# Patient Record
Sex: Male | Born: 1980 | Race: Black or African American | Hispanic: No | Marital: Single | Smoking: Current every day smoker
Health system: Southern US, Community
[De-identification: ages and names within clinical notes are randomized; demographics above are authoritative.]

---

## 2007-06-06 ENCOUNTER — Emergency Department: Payer: Self-pay | Admitting: Emergency Medicine

## 2007-06-07 ENCOUNTER — Ambulatory Visit: Payer: Self-pay | Admitting: Urology

## 2009-05-04 ENCOUNTER — Emergency Department: Payer: Self-pay | Admitting: Emergency Medicine

## 2012-07-03 ENCOUNTER — Emergency Department: Payer: Self-pay | Admitting: Emergency Medicine

## 2012-07-03 LAB — URINALYSIS, COMPLETE
Bacteria: NONE SEEN
Bilirubin,UR: NEGATIVE
Glucose,UR: NEGATIVE mg/dL (ref 0–75)
Ketone: NEGATIVE
Leukocyte Esterase: NEGATIVE
Nitrite: NEGATIVE
Ph: 8 (ref 4.5–8.0)
RBC,UR: 14 /HPF (ref 0–5)
Specific Gravity: 1.013 (ref 1.003–1.030)
Squamous Epithelial: NONE SEEN
WBC UR: 5 /HPF (ref 0–5)

## 2012-07-03 LAB — COMPREHENSIVE METABOLIC PANEL
Anion Gap: 4 — ABNORMAL LOW (ref 7–16)
Co2: 28 mmol/L (ref 21–32)
Creatinine: 1.15 mg/dL (ref 0.60–1.30)
EGFR (African American): >60
EGFR (Non-African Amer.): >60
Osmolality: 280 (ref 275–301)
Potassium: 4.3 mmol/L (ref 3.5–5.1)
Sodium: 140 mmol/L (ref 136–145)

## 2012-07-03 LAB — CBC
HCT: 45.1 % (ref 40.0–52.0)
HGB: 15.1 g/dL (ref 13.0–18.0)
MCHC: 33.6 g/dL (ref 32.0–36.0)
MCV: 98 fL (ref 80–100)
Platelet: 198 10*3/uL (ref 150–440)
RBC: 4.62 10*6/uL (ref 4.40–5.90)
RDW: 12.7 % (ref 11.5–14.5)

## 2012-07-03 LAB — LIPASE, BLOOD: Lipase: 101 U/L (ref 73–393)

## 2012-07-06 ENCOUNTER — Emergency Department: Payer: Self-pay | Admitting: Emergency Medicine

## 2012-07-06 LAB — CBC
HCT: 44.1 % (ref 40.0–52.0)
HGB: 14.9 g/dL (ref 13.0–18.0)
MCHC: 33.7 g/dL (ref 32.0–36.0)
Platelet: 199 10*3/uL (ref 150–440)
RBC: 4.5 10*6/uL (ref 4.40–5.90)
WBC: 5.8 10*3/uL (ref 3.8–10.6)

## 2012-07-06 LAB — URINALYSIS, COMPLETE
Bacteria: NONE SEEN
Blood: NEGATIVE
Ketone: NEGATIVE
Ph: 6 (ref 4.5–8.0)
RBC,UR: 1 /HPF (ref 0–5)
Specific Gravity: 1.019 (ref 1.003–1.030)
Squamous Epithelial: NONE SEEN

## 2012-07-06 LAB — COMPREHENSIVE METABOLIC PANEL
Albumin: 4.1 g/dL (ref 3.4–5.0)
Anion Gap: 5 — ABNORMAL LOW (ref 7–16)
BUN: 14 mg/dL (ref 7–18)
Calcium, Total: 9.2 mg/dL (ref 8.5–10.1)
Chloride: 109 mmol/L — ABNORMAL HIGH (ref 98–107)
Creatinine: 1.08 mg/dL (ref 0.60–1.30)
EGFR (African American): >60
EGFR (Non-African Amer.): >60
SGOT(AST): 22 U/L (ref 15–37)
Total Protein: 7.8 g/dL (ref 6.4–8.2)

## 2014-08-20 ENCOUNTER — Emergency Department
Admission: EM | Admit: 2014-08-20 | Discharge: 2014-08-20 | Disposition: A | Discharge status: Home / Self Care | Payer: Self-pay | Attending: Emergency Medicine | Admitting: Emergency Medicine

## 2014-08-20 ENCOUNTER — Emergency Department: Payer: Self-pay

## 2014-08-20 ENCOUNTER — Other Ambulatory Visit: Payer: Self-pay

## 2014-08-20 DIAGNOSIS — R55 Syncope and collapse: Secondary | ICD-10-CM | POA: Insufficient documentation

## 2014-08-20 DIAGNOSIS — E860 Dehydration: Secondary | ICD-10-CM | POA: Insufficient documentation

## 2014-08-20 LAB — COMPREHENSIVE METABOLIC PANEL
ALT: 11 U/L — ABNORMAL LOW (ref 17–63)
AST: 17 U/L (ref 15–41)
Albumin: 4.2 g/dL (ref 3.5–5.0)
Alkaline Phosphatase: 32 U/L — ABNORMAL LOW (ref 38–126)
Anion gap: 5 (ref 5–15)
BILIRUBIN TOTAL: 0.5 mg/dL (ref 0.3–1.2)
BUN: 13 mg/dL (ref 6–20)
CO2: 26 mmol/L (ref 22–32)
Calcium: 9 mg/dL (ref 8.9–10.3)
Chloride: 109 mmol/L (ref 101–111)
Creatinine, Ser: 1.29 mg/dL — ABNORMAL HIGH (ref 0.61–1.24)
GFR calc non Af Amer: >60 mL/min (ref >60)
Glucose, Bld: 106 mg/dL — ABNORMAL HIGH (ref 65–99)
Potassium: 4.7 mmol/L (ref 3.5–5.1)
Sodium: 140 mmol/L (ref 135–145)
TOTAL PROTEIN: 7 g/dL (ref 6.5–8.1)

## 2014-08-20 LAB — CBC WITH DIFFERENTIAL/PLATELET
BASOS ABS: 0 10*3/uL (ref 0–0.1)
Basophils Relative: 0 %
Eosinophils Absolute: 0 10*3/uL (ref 0–0.7)
Eosinophils Relative: 1 %
HEMATOCRIT: 45.8 % (ref 40.0–52.0)
Hemoglobin: 15.4 g/dL (ref 13.0–18.0)
Lymphocytes Relative: 14 %
Lymphs Abs: 1.5 10*3/uL (ref 1.0–3.6)
MCH: 33.1 pg (ref 26.0–34.0)
MCHC: 33.6 g/dL (ref 32.0–36.0)
MCV: 98.7 fL (ref 80.0–100.0)
Monocytes Absolute: 0.7 10*3/uL (ref 0.2–1.0)
Monocytes Relative: 7 %
Neutro Abs: 8.4 10*3/uL — ABNORMAL HIGH (ref 1.4–6.5)
Neutrophils Relative %: 78 %
Platelets: 143 10*3/uL — ABNORMAL LOW (ref 150–440)
RBC: 4.64 MIL/uL (ref 4.40–5.90)
RDW: 13.2 % (ref 11.5–14.5)
WBC: 10.7 10*3/uL — ABNORMAL HIGH (ref 3.8–10.6)

## 2014-08-20 LAB — ETHANOL: Alcohol, Ethyl (B): <5 mg/dL (ref <5)

## 2014-08-20 LAB — TROPONIN I

## 2014-08-20 LAB — SALICYLATE LEVEL

## 2014-08-20 LAB — ACETAMINOPHEN LEVEL: Acetaminophen (Tylenol), Serum: <10 ug/mL — ABNORMAL LOW (ref 10–30)

## 2014-08-20 LAB — CK: Total CK: 160 U/L (ref 49–397)

## 2014-08-20 LAB — FIBRIN DERIVATIVES D-DIMER (ARMC ONLY): FIBRIN DERIVATIVES D-DIMER (ARMC): 149.08 (ref 0–499)

## 2014-08-20 MED ORDER — SODIUM CHLORIDE 0.9 % IV BOLUS (SEPSIS)
1000.0000 mL | Freq: Once | INTRAVENOUS | Status: AC
  Administered 2014-08-20: 1000 mL via INTRAVENOUS

## 2014-08-20 NOTE — Discharge Instructions (Signed)
1. Drink plenty of fluids daily. 2. Avoid extreme heat and prolonged standing. 3. Return to the ER for worsening symptoms, persistent vomiting, difficulty breathing or other concerns.  Syncope Syncope is a medical term for fainting or passing out. This means you lose consciousness and drop to the ground. People are generally unconscious for less than 5 minutes. You may have some muscle twitches for up to 15 seconds before waking up and returning to normal. Syncope occurs more often in older adults, but it can happen to anyone. While most causes of syncope are not dangerous, syncope can be a sign of a serious medical problem. It is important to seek medical care.  CAUSES  Syncope is caused by a sudden drop in blood flow to the brain. The specific cause is often not determined. Factors that can bring on syncope include:  Taking medicines that lower blood pressure.  Sudden changes in posture, such as standing up quickly.  Taking more medicine than prescribed.  Standing in one place for too long.  Seizure disorders.  Dehydration and excessive exposure to heat.  Low blood sugar (hypoglycemia).  Straining to have a bowel movement.  Heart disease, irregular heartbeat, or other circulatory problems.  Fear, emotional distress, seeing blood, or severe pain. SYMPTOMS  Right before fainting, you may:  Feel dizzy or light-headed.  Feel nauseous.  See all white or all black in your field of vision.  Have cold, clammy skin. DIAGNOSIS  Your health care provider will ask about your symptoms, perform a physical exam, and perform an electrocardiogram (ECG) to record the electrical activity of your heart. Your health care provider may also perform other heart or blood tests to determine the cause of your syncope which may include:  Transthoracic echocardiogram (TTE). During echocardiography, sound waves are used to evaluate how blood flows through your heart.  Transesophageal echocardiogram  (TEE).  Cardiac monitoring. This allows your health care provider to monitor your heart rate and rhythm in real time.  Holter monitor. This is a portable device that records your heartbeat and can help diagnose heart arrhythmias. It allows your health care provider to track your heart activity for several days, if needed.  Stress tests by exercise or by giving medicine that makes the heart beat faster. TREATMENT  In most cases, no treatment is needed. Depending on the cause of your syncope, your health care provider may recommend changing or stopping some of your medicines. HOME CARE INSTRUCTIONS  Have someone stay with you until you feel stable.  Do not drive, use machinery, or play sports until your health care provider says it is okay.  Keep all follow-up appointments as directed by your health care provider.  Lie down right away if you start feeling like you might faint. Breathe deeply and steadily. Wait until all the symptoms have passed.  Drink enough fluids to keep your urine clear or pale yellow.  If you are taking blood pressure or heart medicine, get up slowly and take several minutes to sit and then stand. This can reduce dizziness. SEEK IMMEDIATE MEDICAL CARE IF:   You have a severe headache.  You have unusual pain in the chest, abdomen, or back.  You are bleeding from your mouth or rectum, or you have black or tarry stool.  You have an irregular or very fast heartbeat.  You have pain with breathing.  You have repeated fainting or seizure-like jerking during an episode.  You faint when sitting or lying down.  You have confusion.  You have trouble walking.  You have severe weakness.  You have vision problems. If you fainted, call your local emergency services (911 in [place] Do not drive yourself to the hospital.  MAKE SURE YOU:  Understand these instructions.  Will watch your condition.  Will get help right away if you are not doing well or get  worse. Document Released: 02/27/2005 Document Revised: 03/04/2013 Document Reviewed: 04/28/2011 [facility] Patient Information 2015 [place], [place]. This information is not intended to replace advice given to you by your health care provider. Make sure you discuss any questions you have with your health care provider.  Dehydration, Adult Dehydration is when you lose more fluids from the body than you take in. Vital organs like the kidneys, brain, and heart cannot function without a proper amount of fluids and salt. Any loss of fluids from the body can cause dehydration.  CAUSES   Vomiting.  Diarrhea.  Excessive sweating.  Excessive urine output.  Fever. SYMPTOMS  Mild dehydration  Thirst.  Dry lips.  Slightly dry mouth. Moderate dehydration  Very dry mouth.  Sunken eyes.  Skin does not bounce back quickly when lightly pinched and released.  Dark urine and decreased urine production.  Decreased tear production.  Headache. Severe dehydration  Very dry mouth.  Extreme thirst.  Rapid, weak pulse (more than 100 beats per minute at rest).  Cold hands and feet.  Not able to sweat in spite of heat and temperature.  Rapid breathing.  Blue lips.  Confusion and lethargy.  Difficulty being awakened.  Minimal urine production.  No tears. DIAGNOSIS  Your caregiver will diagnose dehydration based on your symptoms and your exam. Blood and urine tests will help confirm the diagnosis. The diagnostic evaluation should also identify the cause of dehydration. TREATMENT  Treatment of mild or moderate dehydration can often be done at home by increasing the amount of fluids that you drink. It is best to drink small amounts of fluid more often. Drinking too much at one time can make vomiting worse. Refer to the home care instructions below. Severe dehydration needs to be treated at the hospital where you will probably be given intravenous (IV) fluids that contain water and  electrolytes. HOME CARE INSTRUCTIONS   Ask your caregiver about specific rehydration instructions.  Drink enough fluids to keep your urine clear or pale yellow.  Drink small amounts frequently if you have nausea and vomiting.  Eat as you normally do.  Avoid:  Foods or drinks high in sugar.  Carbonated drinks.  Juice.  Extremely hot or cold fluids.  Drinks with caffeine.  Fatty, greasy foods.  Alcohol.  Tobacco.  Overeating.  Gelatin desserts.  Wash your hands well to avoid spreading bacteria and viruses.  Only take over-the-counter or prescription medicines for pain, discomfort, or fever as directed by your caregiver.  Ask your caregiver if you should continue all prescribed and over-the-counter medicines.  Keep all follow-up appointments with your caregiver. SEEK MEDICAL CARE IF:  You have abdominal pain and it increases or stays in one area (localizes).  You have a rash, stiff neck, or severe headache.  You are irritable, sleepy, or difficult to awaken.  You are weak, dizzy, or extremely thirsty. SEEK IMMEDIATE MEDICAL CARE IF:   You are unable to keep fluids down or you get worse despite treatment.  You have frequent episodes of vomiting or diarrhea.  You have blood or green matter (bile) in your vomit.  You have blood in your stool or your  stool looks black and tarry.  You have not urinated in 6 to 8 hours, or you have only urinated a small amount of very dark urine.  You have a fever.  You faint. MAKE SURE YOU:   Understand these instructions.  Will watch your condition.  Will get help right away if you are not doing well or get worse. Document Released: 02/27/2005 Document Revised: 05/22/2011 Document Reviewed: 10/17/2010 [facility] Patient Information 2015 [place], [place]. This information is not intended to replace advice given to you by your health care provider. Make sure you discuss any questions you have with your health care  provider.

## 2014-08-20 NOTE — ED Notes (Signed)
Pt uprite on stretcher in exam room with no distress noted; eating ice chips; family at bedside; pt denies any c/o at present but reports PTA while having intercourse, had sudden onset pain to left chest/arm

## 2014-08-20 NOTE — ED Provider Notes (Signed)
[facility] Emergency Department Provider Note  ____________________________________________  Time seen: Approximately 2:03 AM  I have reviewed the triage vital signs and the nursing notes.   HISTORY  Chief Complaint Loss of Consciousness    HPI Bryan Parsons is a 34 y.o. male who presents via EMS for syncope. Patient states after intercourse he stood up, felt lightheaded and dizzy and sat down on the floor. Patient's spouse says he was "in and out". States had another syncopal episode when EMS stood him up to walk to the ambulance. Patient has had a similar episode due to being overworked and dehydrated. Patient denies headache, chest pain, shortness of breath, vomiting, diarrhea, weakness, numbness, tingling. Patient states he felt abdominal cramping prior to syncopal episode but none currently.Specifically asked patient about chest pain/left arm pain while having intercourse but patient denies.   Past medical history None There are no active problems to display for this patient.  Patient works 3 jobs.  Past surgical history None  No current outpatient prescriptions on file.  Allergies Review of patient's allergies indicates no known allergies.  Family history Grandmother with CAD No history of sudden death No history of cerebral aneurysm  Social History History  Substance Use Topics  . Smoking status: Not on file  . Smokeless tobacco: Not on file  . Alcohol Use: Not on file   Denies tobacco/EtOH/drug use  Review of Systems Constitutional: No fever/chills Eyes: No visual changes. ENT: No sore throat. Cardiovascular: Denies chest pain. Respiratory: Denies shortness of breath. Gastrointestinal: Positive for abdominal cramping.  No nausea, no vomiting.  No diarrhea.  No constipation. Genitourinary: Negative for dysuria. Musculoskeletal: Negative for back pain. Skin: Negative for rash. Neurological: Negative for headaches, focal  weakness or numbness.  10-point ROS otherwise negative.  ____________________________________________   PHYSICAL EXAM:  VITAL SIGNS: ED Triage Vitals  Enc Vitals Group     BP 08/20/14 0100 95/64 mmHg     Pulse Rate 08/20/14 0100 54     Resp 08/20/14 0100 18     Temp 08/20/14 0100 97.5 F (36.4 C)     Temp Source 08/20/14 0100 Oral     SpO2 08/20/14 0100 100 %     Weight 08/20/14 0100 145 lb (65.772 kg)     Height 08/20/14 0100 5\' 8"  (1.727 m)     Head Cir --      Peak Flow --      Pain Score --      Pain Loc --      Pain Edu? --      Excl. in GC? --     Constitutional: Alert and oriented. Well appearing and in no acute distress. Eyes: Conjunctivae are normal. PERRL. EOMI. Head: Atraumatic. Nose: No congestion/rhinnorhea. Mouth/Throat: Mucous membranes are mildly dry.  Oropharynx non-erythematous. Neck: No stridor.   Cardiovascular: Normal rate, regular rhythm. Grossly normal heart sounds.  Good peripheral circulation. Respiratory: Normal respiratory effort.  No retractions. Lungs CTAB. Gastrointestinal: Soft and nontender. No distention. No abdominal bruits. No CVA tenderness. Musculoskeletal: No lower extremity tenderness nor edema.  No joint effusions. Neurologic:  Normal speech and language. No gross focal neurologic deficits are appreciated. Speech is normal.  Skin:  Skin is warm, dry and intact. No rash noted. Psychiatric: Mood and affect are normal. Speech and behavior are normal.  ____________________________________________   LABS (all labs ordered are listed, but only abnormal results are displayed)  Labs Reviewed  CBC WITH DIFFERENTIAL/PLATELET - Abnormal; Notable for the following:  WBC 10.7 (*)    Platelets 143 (*)    Neutro Abs 8.4 (*)    All other components within normal limits  COMPREHENSIVE METABOLIC PANEL - Abnormal; Notable for the following:    Glucose, Bld 106 (*)    Creatinine, Ser 1.29 (*)    ALT 11 (*)    Alkaline Phosphatase 32 (*)     All other components within normal limits  ACETAMINOPHEN LEVEL - Abnormal; Notable for the following:    Acetaminophen (Tylenol), Serum <10 (*)    All other components within normal limits  ETHANOL  TROPONIN I  SALICYLATE LEVEL  CK  FIBRIN DERIVATIVES D-DIMER ([facility] ONLY)   ____________________________________________  EKG  ED ECG REPORT I, SUNG,JADE J, the attending physician, personally viewed and interpreted this ECG.   Date: 08/20/2014  EKG Time: 0058  Rate: 52  Rhythm: sinus bradycardia  Axis: Normal  Intervals:none  ST&T Change: Nonspecific  ____________________________________________  RADIOLOGY  CT head without contrast interpreted by Dr. Benard Rink: Negative  Chest 1 view (viewed by me, interpreted by Dr. Benard Rink): No active disease ____________________________________________   PROCEDURES  Procedure(s) performed: None  Critical Care performed: No  ____________________________________________   INITIAL IMPRESSION / ASSESSMENT AND PLAN / ED COURSE  Pertinent labs & imaging results that were available during my care of the patient were reviewed by me and considered in my medical decision making (see chart for details).  34 year old male who presents with syncopal episodes 2. Will obtain CT head, check EKG and troponin, orthostatics, IV fluids and reassess.  ----------------------------------------- 3:46 AM on 08/20/2014 -----------------------------------------  Patient sleeping soundly and no acute distress. His mother tells me there is a strong family history of blood clots. Will check a d-dimer for mother's concern for blood clot.  ----------------------------------------- 5:08 AM on 08/20/2014 -----------------------------------------  D-dimer is negative. Discussed with patient and his family results of labs and imaging studies and given strict return precautions. All verbalize understanding and agree with plan of  care. ____________________________________________   FINAL CLINICAL IMPRESSION(S) / ED DIAGNOSES  Final diagnoses:  Syncope, unspecified syncope type  Dehydration      Irean Hong, MD 08/20/14 [phone]

## 2014-08-20 NOTE — ED Notes (Signed)
Pt in by ems from home states during intercourse tonight he felt sharp pain to left arm radiating into left chest then he became unresponsive per family.  Upon EMS arrival he was responsive to pain and became responsive after ammonia inhalants was used.  Per EMS pt has been diaphoretic during transport and had a  2nd syncopal episode per EMS staff, pt awake and alert and this time, mother told ems he has hx of the same due to being overworked and dehydrated.  FSBS 105 per EMS IV fluids started per ems.

## 2015-04-06 ENCOUNTER — Encounter: Payer: Self-pay | Admitting: *Deleted

## 2015-04-06 ENCOUNTER — Emergency Department
Admission: EM | Admit: 2015-04-06 | Discharge: 2015-04-06 | Discharge status: Against Medical Advice | Payer: Self-pay | Attending: Emergency Medicine | Admitting: Emergency Medicine

## 2015-04-06 DIAGNOSIS — R1031 Right lower quadrant pain: Secondary | ICD-10-CM | POA: Insufficient documentation

## 2015-04-06 DIAGNOSIS — F17200 Nicotine dependence, unspecified, uncomplicated: Secondary | ICD-10-CM | POA: Insufficient documentation

## 2015-04-06 LAB — CBC WITH DIFFERENTIAL/PLATELET
BASOS ABS: 0.1 10*3/uL (ref 0–0.1)
BASOS PCT: 2 %
EOS ABS: 0.3 10*3/uL (ref 0–0.7)
Eosinophils Relative: 4 %
HCT: 41.7 % (ref 40.0–52.0)
HEMOGLOBIN: 14 g/dL (ref 13.0–18.0)
LYMPHS PCT: 67 %
Lymphs Abs: 4.7 10*3/uL — ABNORMAL HIGH (ref 1.0–3.6)
MCH: 32.2 pg (ref 26.0–34.0)
MCHC: 33.5 g/dL (ref 32.0–36.0)
MCV: 96.1 fL (ref 80.0–100.0)
MONO ABS: 0.6 10*3/uL (ref 0.2–1.0)
Monocytes Relative: 9 %
Neutro Abs: 1.3 10*3/uL — ABNORMAL LOW (ref 1.4–6.5)
Neutrophils Relative %: 18 %
Platelets: 188 10*3/uL (ref 150–440)
RBC: 4.34 MIL/uL — ABNORMAL LOW (ref 4.40–5.90)
RDW: 13.2 % (ref 11.5–14.5)
WBC: 7 10*3/uL (ref 3.8–10.6)

## 2015-04-06 LAB — BASIC METABOLIC PANEL
ANION GAP: 7 (ref 5–15)
BUN: 15 mg/dL (ref 6–20)
CALCIUM: 9.2 mg/dL (ref 8.9–10.3)
CO2: 28 mmol/L (ref 22–32)
Chloride: 103 mmol/L (ref 101–111)
Creatinine, Ser: 1.29 mg/dL — ABNORMAL HIGH (ref 0.61–1.24)
GFR calc non Af Amer: >60 mL/min (ref >60)
Glucose, Bld: 109 mg/dL — ABNORMAL HIGH (ref 65–99)
Potassium: 3.7 mmol/L (ref 3.5–5.1)
SODIUM: 138 mmol/L (ref 135–145)

## 2015-04-06 LAB — URINALYSIS COMPLETE WITH MICROSCOPIC (ARMC ONLY)
BILIRUBIN URINE: NEGATIVE
Bacteria, UA: NONE SEEN
GLUCOSE, UA: NEGATIVE mg/dL
Hgb urine dipstick: NEGATIVE
KETONES UR: NEGATIVE mg/dL
LEUKOCYTES UA: NEGATIVE
NITRITE: NEGATIVE
Protein, ur: NEGATIVE mg/dL
RBC / HPF: NONE SEEN RBC/hpf (ref 0–5)
SPECIFIC GRAVITY, URINE: 1.023 (ref 1.005–1.030)
Squamous Epithelial / LPF: NONE SEEN
WBC, UA: NONE SEEN WBC/hpf (ref 0–5)
pH: 6 (ref 5.0–8.0)

## 2015-04-06 LAB — LIPASE, BLOOD: Lipase: 22 U/L (ref 11–51)

## 2015-04-06 MED ORDER — MORPHINE SULFATE (PF) 4 MG/ML IV SOLN
INTRAVENOUS | Status: AC
  Administered 2015-04-06: 4 mg via INTRAVENOUS
  Filled 2015-04-06: qty 1

## 2015-04-06 MED ORDER — IOHEXOL 240 MG/ML SOLN: 25.0000 mL | Freq: Once | INTRAMUSCULAR | Status: DC | PRN

## 2015-04-06 MED ORDER — ONDANSETRON HCL 4 MG/2ML IJ SOLN
INTRAMUSCULAR | Status: AC
  Administered 2015-04-06: 4 mg via INTRAVENOUS
  Filled 2015-04-06: qty 2

## 2015-04-06 MED ORDER — MORPHINE SULFATE (PF) 4 MG/ML IV SOLN
4.0000 mg | Freq: Once | INTRAVENOUS | Status: AC
Start: 2015-04-06 — End: 2015-04-06
  Administered 2015-04-06: 4 mg via INTRAVENOUS

## 2015-04-06 MED ORDER — ONDANSETRON HCL 4 MG/2ML IJ SOLN
4.0000 mg | Freq: Once | INTRAMUSCULAR | Status: AC
Start: 2015-04-06 — End: 2015-04-06
  Administered 2015-04-06: 4 mg via INTRAVENOUS

## 2015-04-06 NOTE — ED Notes (Signed)
[place] [employer] notified of pt's LWBS status with PIV in place. [employer] stated they would attempt to locate pt at given address on registration facesheet and would inform [facility] ED as soon as information becomes available.

## 2015-04-06 NOTE — ED Notes (Signed)
Pt's contact number [phone]) as listed on registration facesheet is no longer in service/disconnected. Will notify Charge RN to determine next course of action regarding pt's PIV issues.

## 2015-04-06 NOTE — ED Notes (Signed)
Received phone call that pt had gone to [facility] for treatment. Charge RN reports the pt had removed th PIV placed here and was currently under the care of [facility].

## 2015-04-06 NOTE — ED Notes (Signed)
Pt reports he was awakened with pain tonight in rlq.  Pt states it felt like he had to pass gas but could not.  No n/v/d.  Pt walking bent over holding abdomen.

## 2015-04-06 NOTE — ED Notes (Signed)
Pt walked out d/t c/o waiting too long to be seen by EDP and not being given enough pain medications.. Pt was given  of Morphine and  of Zofran at 251am and was immediately requesting more pain medication by 300am. Unknown if pt removed PIV prior to leaving; this RN will attempt to call pt's contact numbers to verify if pt left with IV in place or not.

## 2016-09-28 ENCOUNTER — Emergency Department
Admission: EM | Admit: 2016-09-28 | Discharge: 2016-09-28 | Disposition: A | Discharge status: Home / Self Care | Payer: Self-pay | Attending: Emergency Medicine | Admitting: Emergency Medicine

## 2016-09-28 DIAGNOSIS — F17210 Nicotine dependence, cigarettes, uncomplicated: Secondary | ICD-10-CM | POA: Insufficient documentation

## 2016-09-28 DIAGNOSIS — Z87442 Personal history of urinary calculi: Secondary | ICD-10-CM | POA: Insufficient documentation

## 2016-09-28 DIAGNOSIS — R1012 Left upper quadrant pain: Secondary | ICD-10-CM | POA: Insufficient documentation

## 2016-09-28 LAB — COMPREHENSIVE METABOLIC PANEL
ALBUMIN: 4.4 g/dL (ref 3.5–5.0)
ALK PHOS: 42 U/L (ref 38–126)
ALT: 16 U/L — AB (ref 17–63)
AST: 22 U/L (ref 15–41)
Anion gap: 9 (ref 5–15)
BILIRUBIN TOTAL: 1 mg/dL (ref 0.3–1.2)
BUN: 13 mg/dL (ref 6–20)
CALCIUM: 9.3 mg/dL (ref 8.9–10.3)
CO2: 26 mmol/L (ref 22–32)
Chloride: 103 mmol/L (ref 101–111)
Creatinine, Ser: 1.32 mg/dL — ABNORMAL HIGH (ref 0.61–1.24)
GFR calc Af Amer: >60 mL/min (ref >60)
GFR calc non Af Amer: >60 mL/min (ref >60)
GLUCOSE: 95 mg/dL (ref 65–99)
Potassium: 4.2 mmol/L (ref 3.5–5.1)
SODIUM: 138 mmol/L (ref 135–145)
Total Protein: 7.7 g/dL (ref 6.5–8.1)

## 2016-09-28 LAB — URINALYSIS, COMPLETE (UACMP) WITH MICROSCOPIC
BACTERIA UA: NONE SEEN
BILIRUBIN URINE: NEGATIVE
Glucose, UA: NEGATIVE mg/dL
KETONES UR: NEGATIVE mg/dL
LEUKOCYTES UA: NEGATIVE
Nitrite: NEGATIVE
Protein, ur: NEGATIVE mg/dL
SQUAMOUS EPITHELIAL / LPF: NONE SEEN
Specific Gravity, Urine: 1.006 (ref 1.005–1.030)
pH: 7 (ref 5.0–8.0)

## 2016-09-28 LAB — CBC WITH DIFFERENTIAL/PLATELET
BASOS ABS: 0 10*3/uL (ref 0–0.1)
BASOS PCT: 1 %
EOS ABS: 0.2 10*3/uL (ref 0–0.7)
Eosinophils Relative: 4 %
HEMATOCRIT: 44 % (ref 40.0–52.0)
HEMOGLOBIN: 15 g/dL (ref 13.0–18.0)
Lymphocytes Relative: 50 %
Lymphs Abs: 1.9 10*3/uL (ref 1.0–3.6)
MCH: 33.1 pg (ref 26.0–34.0)
MCHC: 34.1 g/dL (ref 32.0–36.0)
MCV: 96.8 fL (ref 80.0–100.0)
Monocytes Absolute: 0.5 10*3/uL (ref 0.2–1.0)
Monocytes Relative: 12 %
NEUTROS ABS: 1.3 10*3/uL — AB (ref 1.4–6.5)
Neutrophils Relative %: 33 %
Platelets: 195 10*3/uL (ref 150–440)
RBC: 4.54 MIL/uL (ref 4.40–5.90)
RDW: 13 % (ref 11.5–14.5)
WBC: 3.9 10*3/uL (ref 3.8–10.6)

## 2016-09-28 NOTE — Discharge Instructions (Signed)
[facility] if any continued problems. Increase fluids.  Read about abdominal pain. Return to the emergency room if any severe worsening of your symptoms.

## 2016-09-28 NOTE — ED Notes (Signed)
Arrives to room 42 ambulatory.  Was at work and had sudden onset left upper quad abd pain sharp.  Says this has happened before .  Tried to have bm and did go, but the pain continued.

## 2016-09-28 NOTE — ED Triage Notes (Signed)
Pt reports left flank pain that started suddenly this am. Pt reports history of kidney stones and states that this feels like they have in the past. Denies all other sx;s. Denies Nausea or difficulty urinating.

## 2016-09-28 NOTE — ED Provider Notes (Signed)
[facility] Emergency Department Provider Note  ____________________________________________   First MD Initiated Contact with Patient 09/28/16 1122     (approximate)  I have reviewed the triage vital signs and the nursing notes.   HISTORY  Chief Complaint Flank Pain   HPI Bryan Parsons is a 36 y.o. male is here with complaint of sudden onset of left upper abdominal pain that started approximately 20 minutes ago. 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 He states that there is been no history of injury and he has not strained today. Patient denies any urinary symptoms, nausea or vomiting. Patient does have a history of kidney stones and initially he felt this pain was similar. He denies any bowel or bladder problems. Patient rates his pain as a 7 out of 10.   No past medical history on file.  There are no active problems to display for this patient.   No past surgical history on file.  Prior to Admission medications   Not on File    Allergies Patient has no known allergies.  No family history on file.  Social History Social History  Substance Use Topics  . Smoking status: Current Every Day Smoker  . Smokeless tobacco: Not on file  . Alcohol use Yes    Review of Systems Constitutional: No fever/chills Cardiovascular: Denies chest pain. Respiratory: Denies shortness of breath. Gastrointestinal: Left upper quadrant pain. No nausea, no vomiting.  Genitourinary: Negative for dysuria. Musculoskeletal: Negative for back pain. Skin: Negative for rash. Neurological: Negative for headaches, focal weakness or numbness.   ____________________________________________   PHYSICAL EXAM:  VITAL SIGNS: ED Triage Vitals [09/28/16 1114]  Enc Vitals Group     BP 109/68     Pulse Rate 72     Resp 20     Temp 98 F (36.7 C)     Temp Source Oral     SpO2 100 %     Weight 155 lb (70.3 kg)   Height 5\' 8"  (1.727 m)     Head Circumference      Peak Flow      Pain Score 7     Pain Loc      Pain Edu?      Excl. in GC?     Constitutional: Alert and oriented. Well appearing and in no acute distress. Eyes: Conjunctivae are normal.  Head: Atraumatic. Nose: No congestion/rhinnorhea. Mouth/Throat: Mucous membranes are moist.  Oropharynx non-erythematous. Neck: No stridor.   Cardiovascular: Normal rate, regular rhythm. Grossly normal heart sounds.  Good peripheral circulation. Respiratory: Normal respiratory effort.  No retractions. Lungs CTAB. Gastrointestinal: Soft and nontender. No distention. No CVA tenderness.Bowel sounds normoactive 4 quadrants. Musculoskeletal: Moves upper and lower extremities without any difficulty. Normal gait was noted. Neurologic:  Normal speech and language. No gross focal neurologic deficits are appreciated. No gait instability. Skin:  Skin is warm, dry and intact. No rash noted. Psychiatric: Mood and affect are normal. Speech and behavior are normal.  ____________________________________________   LABS (all labs ordered are listed, but only abnormal results are displayed)  Labs Reviewed  URINALYSIS, COMPLETE (UACMP) WITH MICROSCOPIC - Abnormal; Notable for the following:       Result Value   Color, Urine STRAW (*)    APPearance CLEAR (*)    Hgb urine dipstick MODERATE (*)    All other components within normal limits  CBC WITH DIFFERENTIAL/PLATELET - Abnormal; Notable for the following:  Neutro Abs 1.3 (*)    All other components within normal limits  COMPREHENSIVE METABOLIC PANEL - Abnormal; Notable for the following:    Creatinine, Ser 1.32 (*)    ALT 16 (*)    All other components within normal limits  URINE CULTURE    PROCEDURES  Procedure(s) performed: None  Procedures  Critical Care performed: No  ____________________________________________   INITIAL IMPRESSION / ASSESSMENT AND PLAN / ED COURSE  Pertinent labs &  imaging results that were available during my care of the patient were reviewed by me and considered in my medical decision making (see chart for details).  Prior to results of patient's labwork patient states that his pain had completely resolved and that she was feeling much better. When provider went to tell him the results of his test patient was sleeping. Patient again states that he has absolutely no abdominal pain at this time. Patient was discharged to return home. He was given instructions about abdominal pain and to return to the emergency room if any severe worsening or return of his symptoms. He is to follow-up with [facility]  clinic if any continued problems also.      ____________________________________________   FINAL CLINICAL IMPRESSION(S) / ED DIAGNOSES  Final diagnoses:  Left upper quadrant pain      NEW MEDICATIONS STARTED DURING THIS VISIT:  There are no discharge medications for this patient.    Note:  This document was prepared using Dragon voice recognition software and may include unintentional dictation errors.    Tommi Rumps, PA-C 09/28/16 1437    Jene Every, MD 09/28/16 [phone]

## 2016-09-29 LAB — URINE CULTURE: Culture: NO GROWTH

## 2016-10-02 IMAGING — CT CT HEAD W/O CM
1 series · 16 of 30 positions shown, 20 images · non-contrast
Comparison: None.

CLINICAL DATA: Headache and syncopal episodes occurring several hr
earlier.

EXAM:
CT HEAD WITHOUT CONTRAST
TECHNIQUE: Contiguous axial images were obtained from the base of the skull
through the vertex without intravenous contrast.

[Series 2: head wo · axial · 0.41mm/px · z∈[-102,+24]mm · 16 of 32 slices shown, 20 images]
[im 2/32  brain]
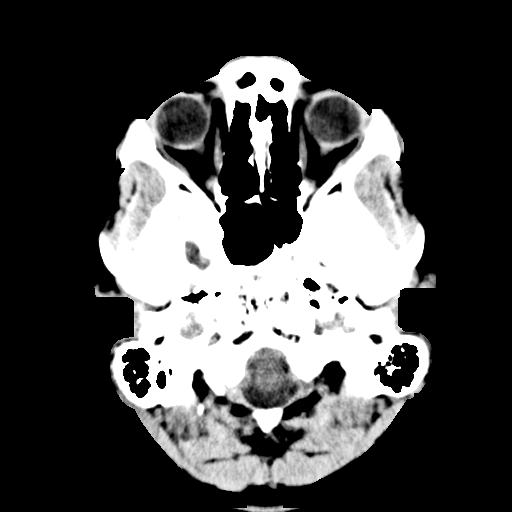
[im 2/32  bone]
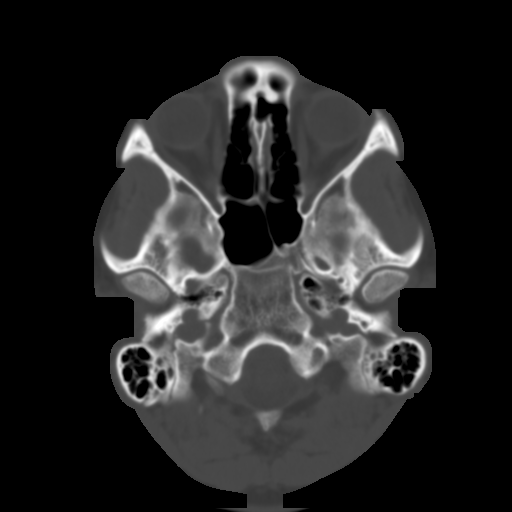
[im 4/32  brain]
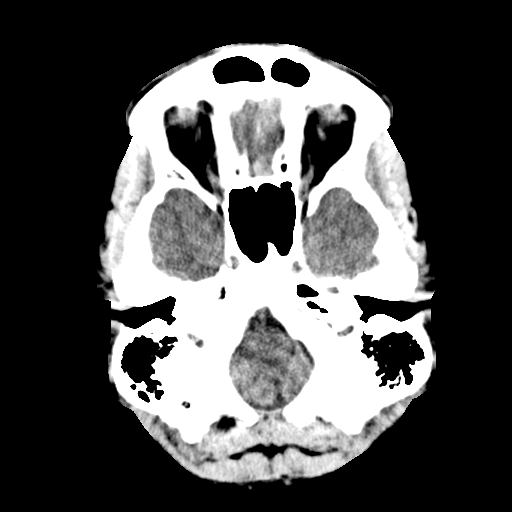
[im 6/32  brain]
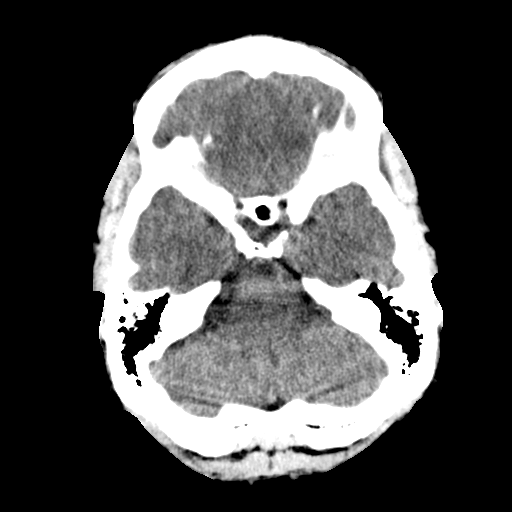
[im 8/32  brain]
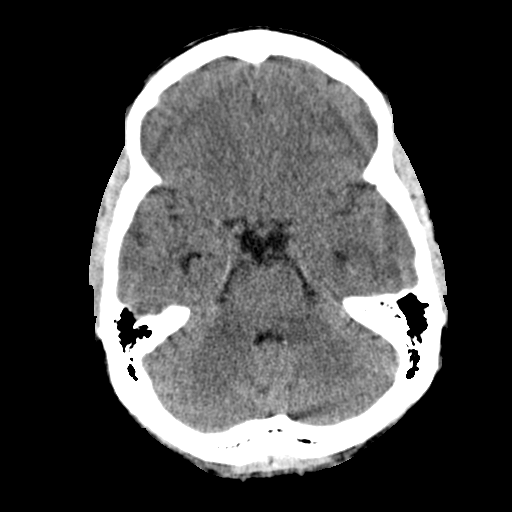
[im 9/32  brain]
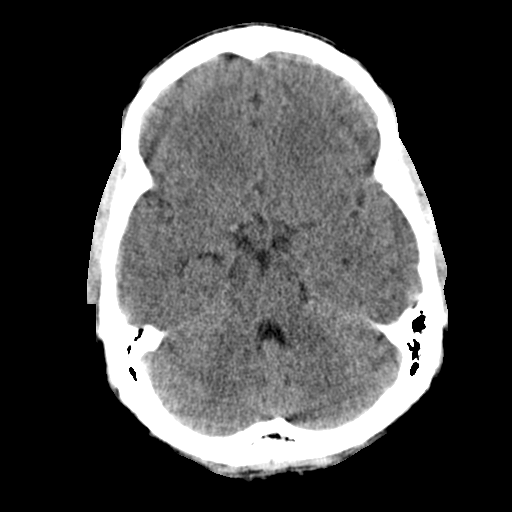
[im 9/32  bone]
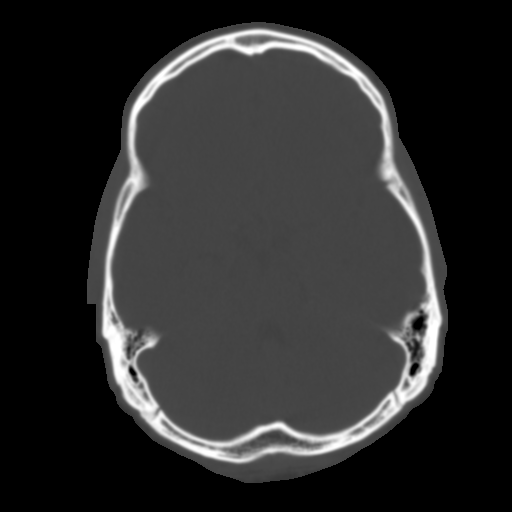
[im 11/32  brain]
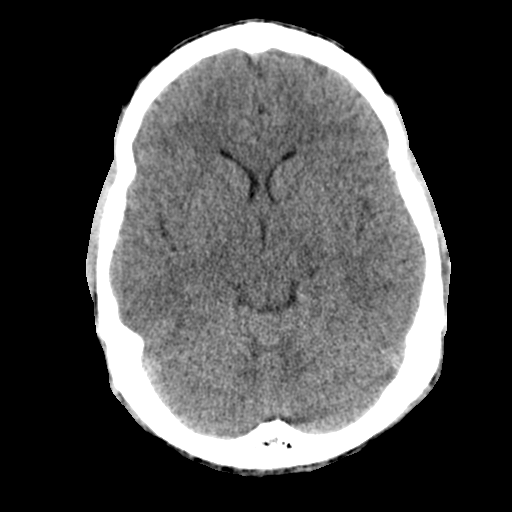
[im 13/32  brain]
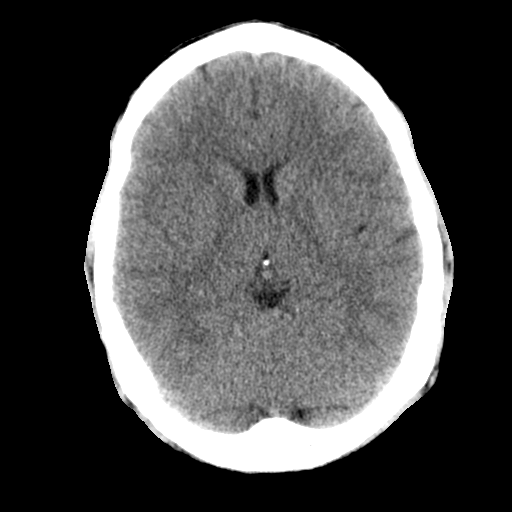
[im 15/32  brain]
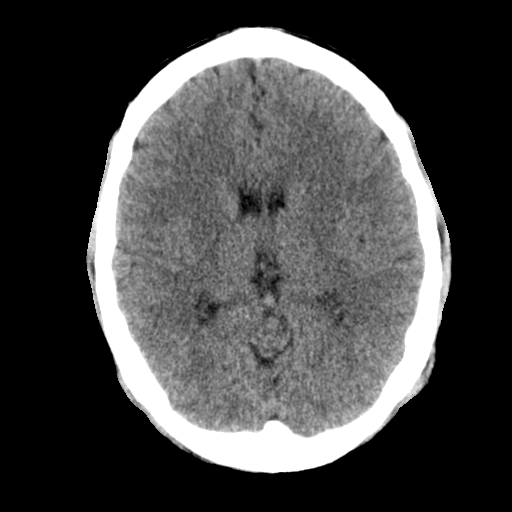
[im 17/32  brain]
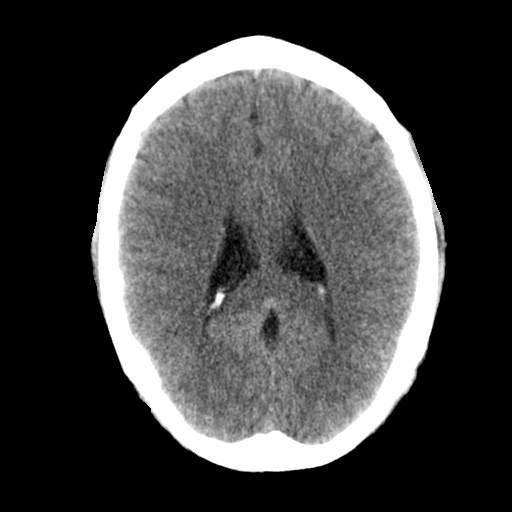
[im 17/32  bone]
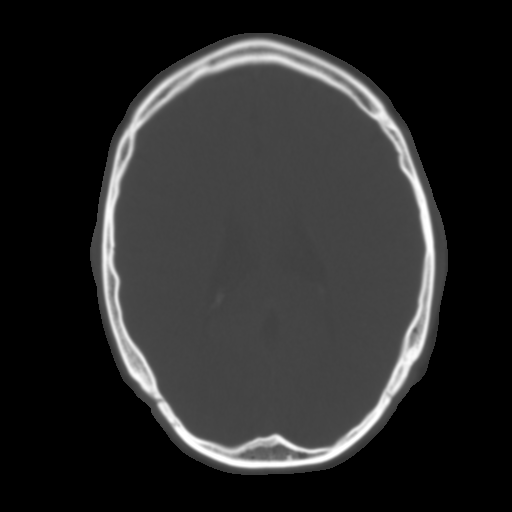
[im 19/32  brain]
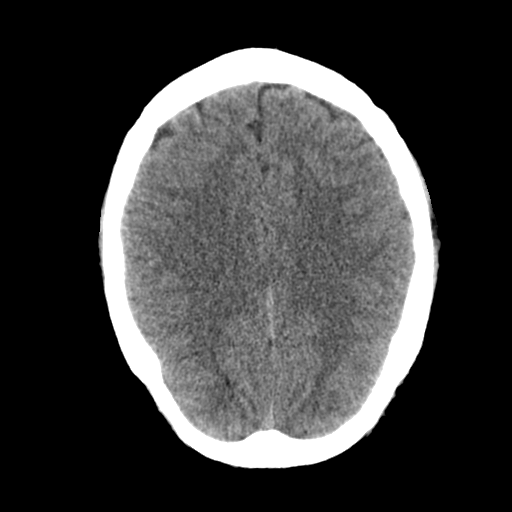
[im 21/32  brain]
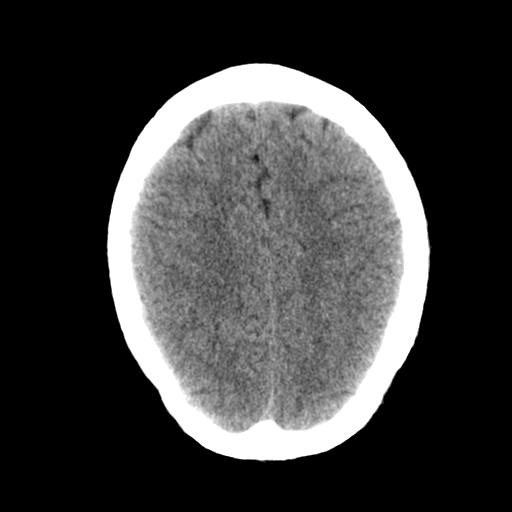
[im 23/32  brain]
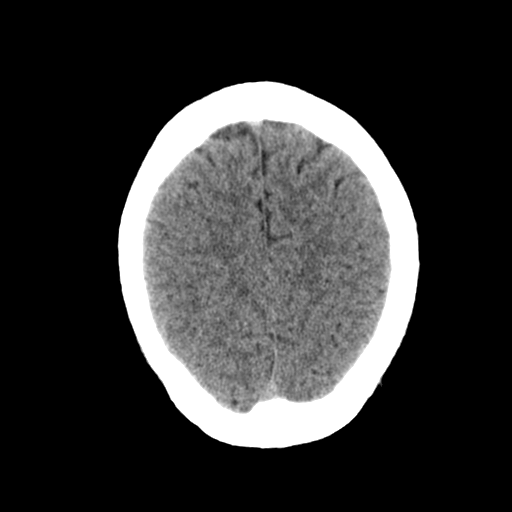
[im 24/32  brain]
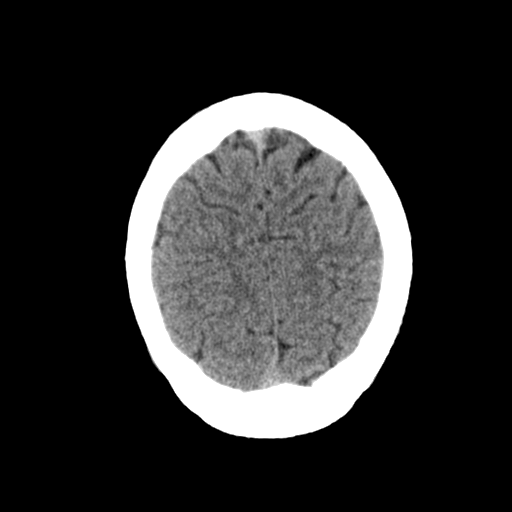
[im 24/32  bone]
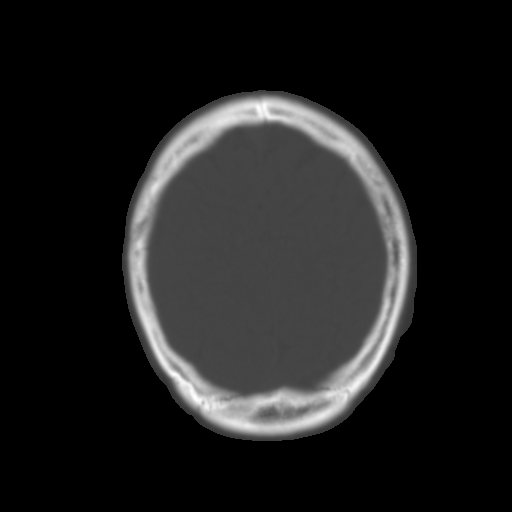
[im 26/32  brain]
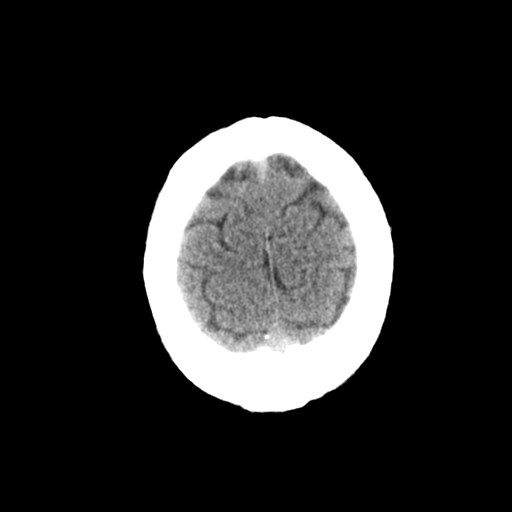
[im 28/32  brain]
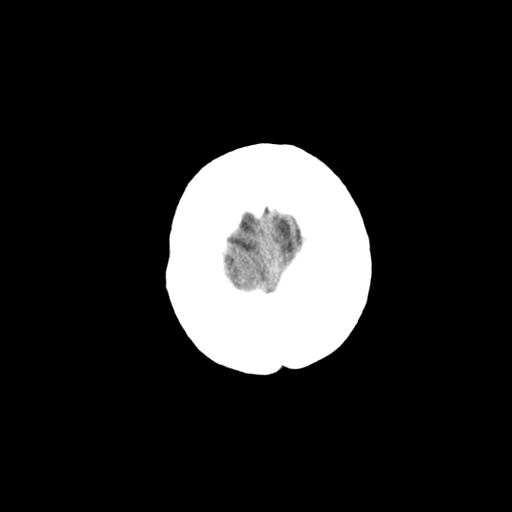
[im 30/32  brain]
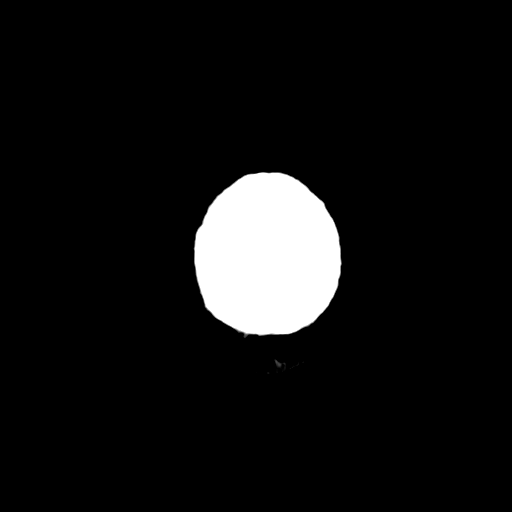

[16 of 30 positions shown; findings below may reference images not displayed]

FINDINGS: No evidence for acute infarction, hemorrhage, mass lesion,
hydrocephalus, or extra-axial fluid. No atrophy or white matter
disease. Intact calvarium. No acute sinus or mastoid disease.
IMPRESSION: Negative exam.
